# Patient Record
Sex: Male | Born: 2000 | Race: White | Hispanic: No | Marital: Single | Smoking: Never smoker
Health system: Southern US, Community
[De-identification: ages and names within clinical notes are randomized; demographics above are authoritative.]

---
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
## 2020-12-08 IMAGING — DX DG CHEST 2V
2 series · 2 of 2 positions shown · non-contrast
Comparison: None.

CLINICAL DATA: Cough, fever, body aches

EXAM:
CHEST - 2 VIEW

[chest pa]
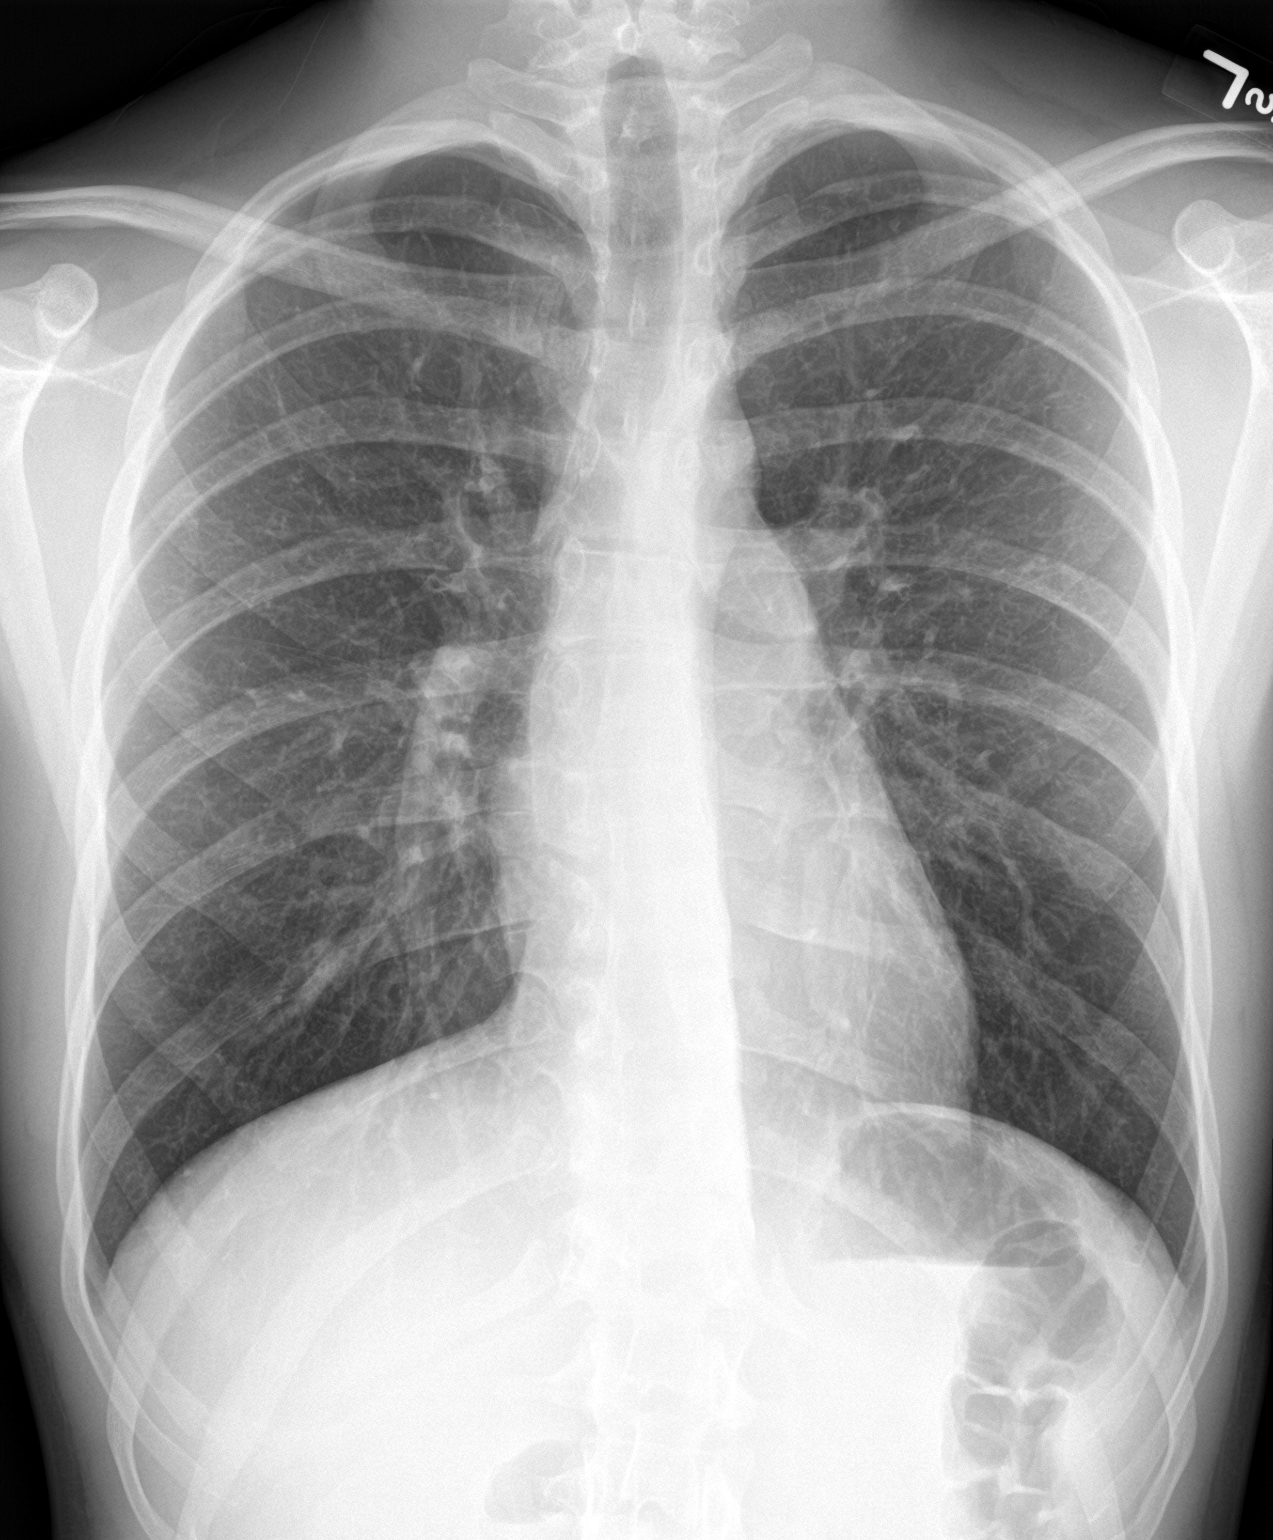

[chest lat]
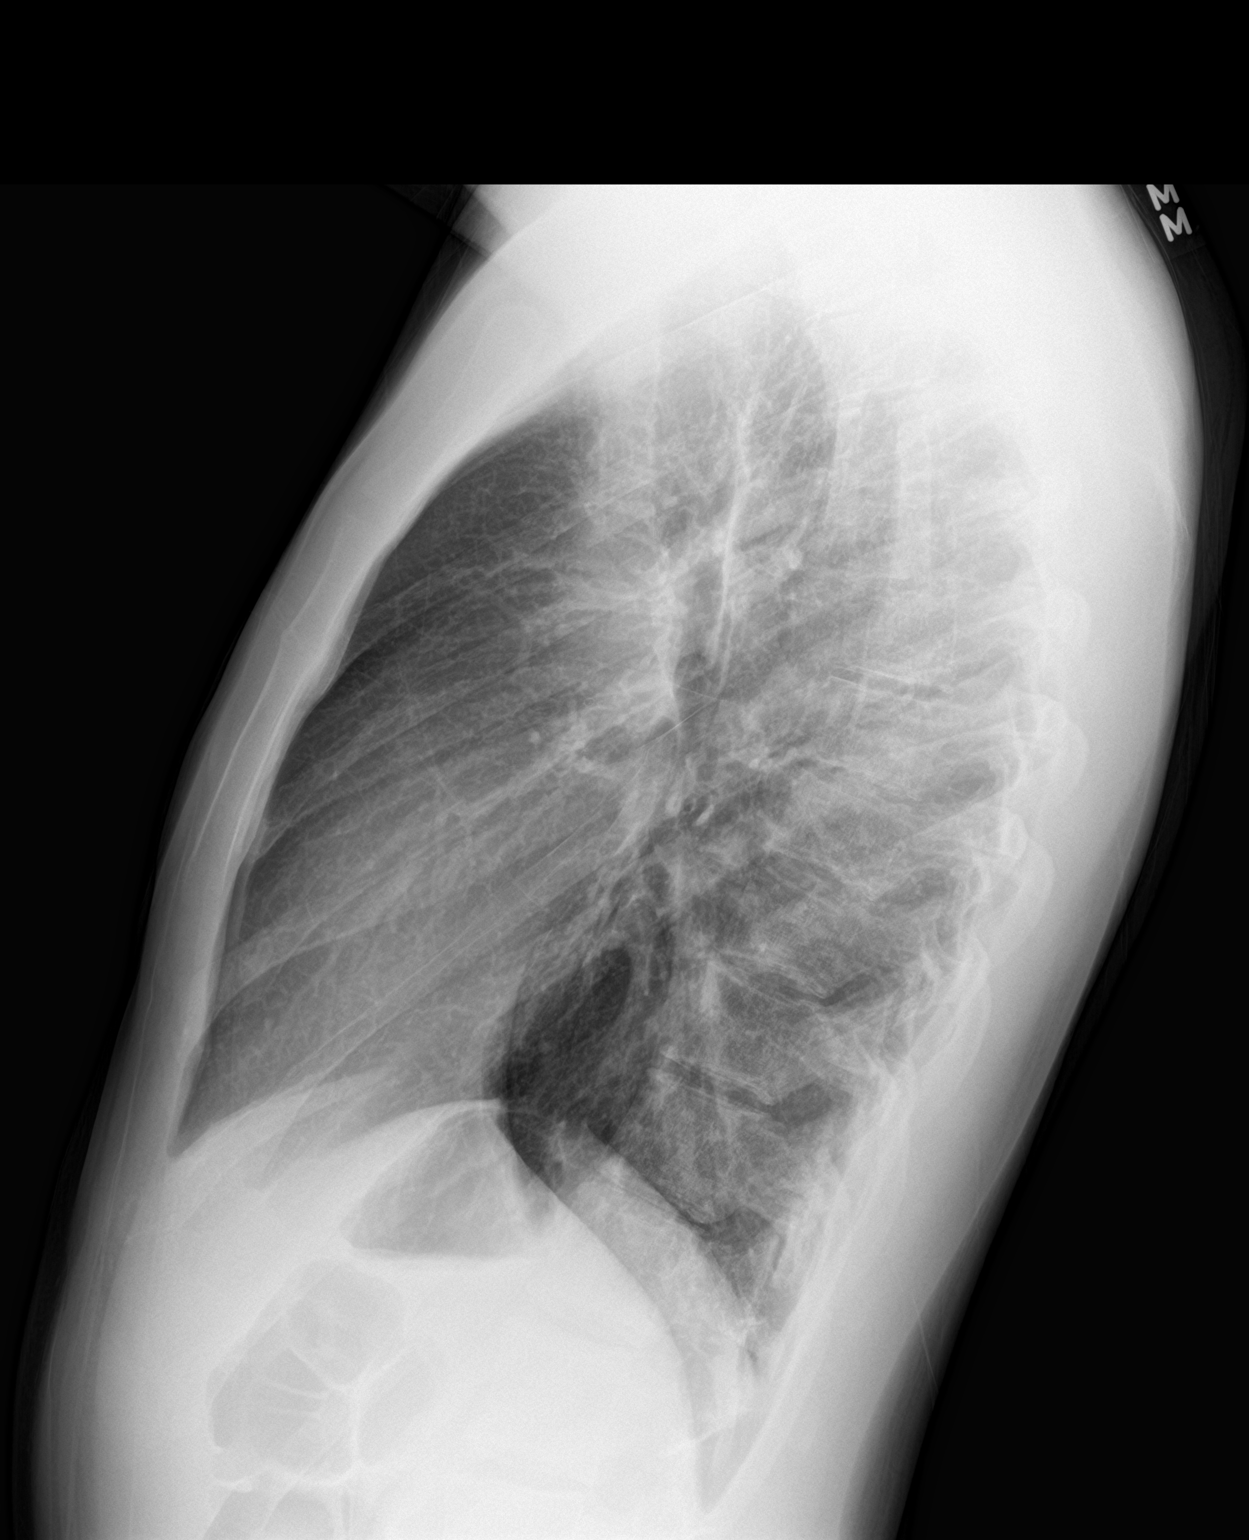

[2 of 2 positions shown; findings below may reference images not displayed]

FINDINGS: Mild peribronchial thickening. Heart and mediastinal contours are
within normal limits. No focal opacities or effusions. No acute bony
abnormality.
IMPRESSION: Mild bronchitic changes.
